# Patient Record
Sex: Male | Born: 1975 | Race: White | Hispanic: No | Marital: Married | Smoking: Never smoker
Health system: Southern US, Community
[De-identification: ages and names within clinical notes are randomized; demographics above are authoritative.]

## PROBLEM LIST (undated history)

## (undated) DIAGNOSIS — K589 Irritable bowel syndrome without diarrhea: Secondary | ICD-10-CM

## (undated) DIAGNOSIS — J45909 Unspecified asthma, uncomplicated: Secondary | ICD-10-CM

## (undated) DIAGNOSIS — N200 Calculus of kidney: Secondary | ICD-10-CM

## (undated) HISTORY — PX: LITHOTRIPSY: SUR834

---

## 2010-10-30 ENCOUNTER — Ambulatory Visit: Payer: Self-pay | Admitting: Internal Medicine

## 2011-05-05 ENCOUNTER — Emergency Department: Payer: Self-pay | Admitting: Emergency Medicine

## 2014-04-12 ENCOUNTER — Emergency Department: Payer: Self-pay | Admitting: Emergency Medicine

## 2014-04-12 LAB — URINALYSIS, COMPLETE
Bilirubin,UR: NEGATIVE
Glucose,UR: NEGATIVE mg/dL (ref 0–75)
KETONE: NEGATIVE
LEUKOCYTE ESTERASE: NEGATIVE
NITRITE: NEGATIVE
Ph: 6 (ref 4.5–8.0)
RBC,UR: 130 /HPF (ref 0–5)
Specific Gravity: 1.027 (ref 1.003–1.030)
Squamous Epithelial: <1

## 2014-04-12 LAB — CBC
HCT: 41.1 % (ref 40.0–52.0)
HGB: 14 g/dL (ref 13.0–18.0)
MCH: 30.3 pg (ref 26.0–34.0)
MCHC: 33.9 g/dL (ref 32.0–36.0)
MCV: 89 fL (ref 80–100)
PLATELETS: 214 10*3/uL (ref 150–440)
RBC: 4.6 10*6/uL (ref 4.40–5.90)
RDW: 12.9 % (ref 11.5–14.5)
WBC: 9.4 10*3/uL (ref 3.8–10.6)

## 2014-04-12 LAB — BASIC METABOLIC PANEL
Anion Gap: 8 (ref 7–16)
BUN: 21 mg/dL — ABNORMAL HIGH (ref 7–18)
CHLORIDE: 106 mmol/L (ref 98–107)
CO2: 26 mmol/L (ref 21–32)
Calcium, Total: 9.1 mg/dL (ref 8.5–10.1)
Creatinine: 1.5 mg/dL — ABNORMAL HIGH (ref 0.60–1.30)
EGFR (African American): >60
GFR CALC NON AF AMER: 58 — AB
Glucose: 118 mg/dL — ABNORMAL HIGH (ref 65–99)
OSMOLALITY: 283 (ref 275–301)
POTASSIUM: 3.9 mmol/L (ref 3.5–5.1)
Sodium: 140 mmol/L (ref 136–145)

## 2014-04-15 ENCOUNTER — Ambulatory Visit: Payer: Self-pay | Admitting: Urology

## 2014-04-16 ENCOUNTER — Emergency Department: Payer: Self-pay | Admitting: Emergency Medicine

## 2014-04-16 LAB — URINALYSIS, COMPLETE
BACTERIA: NONE SEEN
Bilirubin,UR: NEGATIVE
Glucose,UR: NEGATIVE mg/dL (ref 0–75)
Ketone: NEGATIVE
Nitrite: NEGATIVE
PH: 5 (ref 4.5–8.0)
Protein: 100
Specific Gravity: 1.021 (ref 1.003–1.030)
Squamous Epithelial: NONE SEEN
WBC UR: 122 /HPF (ref 0–5)

## 2014-04-16 LAB — COMPREHENSIVE METABOLIC PANEL
ALK PHOS: 111 U/L
AST: 48 U/L — AB (ref 15–37)
Albumin: 3.5 g/dL (ref 3.4–5.0)
Anion Gap: 7 (ref 7–16)
BUN: 19 mg/dL — ABNORMAL HIGH (ref 7–18)
Bilirubin,Total: 0.3 mg/dL (ref 0.2–1.0)
CALCIUM: 9.2 mg/dL (ref 8.5–10.1)
Chloride: 104 mmol/L (ref 98–107)
Co2: 30 mmol/L (ref 21–32)
Creatinine: 1.25 mg/dL (ref 0.60–1.30)
EGFR (African American): >60
EGFR (Non-African Amer.): >60
Glucose: 140 mg/dL — ABNORMAL HIGH (ref 65–99)
Osmolality: 286 (ref 275–301)
Potassium: 4 mmol/L (ref 3.5–5.1)
SGPT (ALT): 50 U/L (ref 12–78)
Sodium: 141 mmol/L (ref 136–145)
Total Protein: 8.3 g/dL — ABNORMAL HIGH (ref 6.4–8.2)

## 2014-04-16 LAB — CBC WITH DIFFERENTIAL/PLATELET
Basophil #: 0.1 10*3/uL (ref 0.0–0.1)
Basophil %: 0.8 %
EOS ABS: 0.3 10*3/uL (ref 0.0–0.7)
EOS PCT: 3.3 %
HCT: 39.6 % — AB (ref 40.0–52.0)
HGB: 13.4 g/dL (ref 13.0–18.0)
Lymphocyte #: 2 10*3/uL (ref 1.0–3.6)
Lymphocyte %: 20.7 %
MCH: 30.4 pg (ref 26.0–34.0)
MCHC: 33.9 g/dL (ref 32.0–36.0)
MCV: 90 fL (ref 80–100)
MONOS PCT: 5.6 %
Monocyte #: 0.5 x10 3/mm (ref 0.2–1.0)
Neutrophil #: 6.6 10*3/uL — ABNORMAL HIGH (ref 1.4–6.5)
Neutrophil %: 69.6 %
Platelet: 242 10*3/uL (ref 150–440)
RBC: 4.42 10*6/uL (ref 4.40–5.90)
RDW: 12.8 % (ref 11.5–14.5)
WBC: 9.5 10*3/uL (ref 3.8–10.6)

## 2015-01-21 NOTE — Op Note (Signed)
PATIENT NAME:  Theodore Warren, Theodore Warren MR#:  [id number] DATE OF BIRTH:  March 08, 1976  DATE OF PROCEDURE:  04/15/2014  PREOPERATIVE DIAGNOSIS: Upper left ureteral calculus with obstruction.   POSTOPERATIVE DIAGNOSIS: Upper left ureteral calculus with obstruction.   SURGEON: Richard D. Elnoria Howard, DO.   PROCEDURES:   1.  Left retrograde pyelogram. 2.  Ureteroscopy. 3.  Left laser lithotripsy with stent.   ANESTHESIA: General.   COMPLICATIONS: None.   DESCRIPTION OF PROCEDURE:  The patient was sterilely prepped and draped in supine lithotomy position for ease of approach to the external genitalia, and after an appropriate timeout, I began the procedure. The patient has good relaxation from a general anesthetic.  The urethra is normal.  Bladder is normal. The ureters are in a normal position. A left retrograde is done with a 5 [place] open-ended catheter.  Calculus is seen in the distal portion of the upper third of the ureter.  I put a wire up by it easily and over the wire I placed a 26 cm ureteral access catheter, which is a Chemical engineer catheter, and through this catheter I placed a flexible ureteroscope, and through the flexible ureteroscope place another wire that is within the access sheath. Then through the flexible digital ureteroscope, I was able to easily see the calculus right in front of me, right at the end of the access catheter, able to break it up quickly into multiple pieces utilizing a 365 micron holmium laser.  Breaks up into many, many pieces, and so I am not able to extract any at this point.  I wanted just to put a stent up and allow them to pass on their own. So, this was done and the stent is in good position in the bladder.  The bladder was emptied with a cystoscope and once it was emptied, I placed 30 mL of 0.5% plain Marcaine there and put B and O suppository in the rectum. Rectal exam is negative for tumors, masses, growths, or prostate cancer.      ____________________________ Janice Coffin. Elnoria Howard, [place] rdh:ts D: 04/15/2014 15:37:16 ET T: 04/15/2014 22:07:17 ET JOB#: [id number]  cc: Janice Coffin. Elnoria Howard, DO, <Dictator> RICHARD D HART DO ELECTRONICALLY SIGNED 04/28/2014 14:08

## 2017-05-27 ENCOUNTER — Encounter: Payer: Self-pay | Admitting: Emergency Medicine

## 2017-05-27 ENCOUNTER — Emergency Department
Admission: EM | Admit: 2017-05-27 | Discharge: 2017-05-27 | Disposition: A | Discharge status: Home / Self Care | Payer: Self-pay | Attending: Emergency Medicine | Admitting: Emergency Medicine

## 2017-05-27 ENCOUNTER — Emergency Department: Payer: Self-pay

## 2017-05-27 DIAGNOSIS — N23 Unspecified renal colic: Secondary | ICD-10-CM

## 2017-05-27 DIAGNOSIS — N132 Hydronephrosis with renal and ureteral calculous obstruction: Secondary | ICD-10-CM | POA: Insufficient documentation

## 2017-05-27 DIAGNOSIS — J45909 Unspecified asthma, uncomplicated: Secondary | ICD-10-CM | POA: Insufficient documentation

## 2017-05-27 HISTORY — DX: Calculus of kidney: N20.0

## 2017-05-27 HISTORY — DX: Unspecified asthma, uncomplicated: J45.909

## 2017-05-27 HISTORY — DX: Irritable bowel syndrome, unspecified: K58.9

## 2017-05-27 HISTORY — DX: Irritable bowel syndrome without diarrhea: K58.9

## 2017-05-27 LAB — URINALYSIS, COMPLETE (UACMP) WITH MICROSCOPIC
Bacteria, UA: NONE SEEN
Bilirubin Urine: NEGATIVE
GLUCOSE, UA: NEGATIVE mg/dL
Ketones, ur: NEGATIVE mg/dL
Leukocytes, UA: NEGATIVE
NITRITE: NEGATIVE
Protein, ur: 30 mg/dL — AB
SPECIFIC GRAVITY, URINE: 1.029 (ref 1.005–1.030)
Squamous Epithelial / LPF: NONE SEEN
pH: 5 (ref 5.0–8.0)

## 2017-05-27 LAB — CBC WITH DIFFERENTIAL/PLATELET
BASOS PCT: 1 %
Basophils Absolute: 0.1 10*3/uL (ref 0–0.1)
EOS ABS: 0.3 10*3/uL (ref 0–0.7)
Eosinophils Relative: 4 %
HCT: 40.2 % (ref 40.0–52.0)
HEMOGLOBIN: 13.9 g/dL (ref 13.0–18.0)
Lymphocytes Relative: 31 %
Lymphs Abs: 2.6 10*3/uL (ref 1.0–3.6)
MCH: 30.1 pg (ref 26.0–34.0)
MCHC: 34.6 g/dL (ref 32.0–36.0)
MCV: 87 fL (ref 80.0–100.0)
MONOS PCT: 9 %
Monocytes Absolute: 0.7 10*3/uL (ref 0.2–1.0)
NEUTROS PCT: 55 %
Neutro Abs: 4.7 10*3/uL (ref 1.4–6.5)
Platelets: 224 10*3/uL (ref 150–440)
RBC: 4.62 MIL/uL (ref 4.40–5.90)
RDW: 12.9 % (ref 11.5–14.5)
WBC: 8.4 10*3/uL (ref 3.8–10.6)

## 2017-05-27 LAB — BASIC METABOLIC PANEL
Anion gap: 9 (ref 5–15)
BUN: 27 mg/dL — ABNORMAL HIGH (ref 6–20)
CALCIUM: 9 mg/dL (ref 8.9–10.3)
CO2: 24 mmol/L (ref 22–32)
CREATININE: 1.08 mg/dL (ref 0.61–1.24)
Chloride: 108 mmol/L (ref 101–111)
Glucose, Bld: 141 mg/dL — ABNORMAL HIGH (ref 65–99)
Potassium: 3.5 mmol/L (ref 3.5–5.1)
Sodium: 141 mmol/L (ref 135–145)

## 2017-05-27 MED ORDER — HYDROMORPHONE HCL 1 MG/ML IJ SOLN
0.5000 mg | Freq: Once | INTRAMUSCULAR | Status: AC
  Administered 2017-05-27: 0.5 mg via INTRAVENOUS
  Filled 2017-05-27: qty 1

## 2017-05-27 MED ORDER — ONDANSETRON 4 MG PO TBDP: 4.0000 mg | ORAL_TABLET | Freq: Three times a day (TID) | ORAL | 0 refills | Status: AC | PRN

## 2017-05-27 MED ORDER — ONDANSETRON HCL 4 MG/2ML IJ SOLN
4.0000 mg | Freq: Once | INTRAMUSCULAR | Status: AC
  Administered 2017-05-27: 4 mg via INTRAVENOUS
  Filled 2017-05-27: qty 2

## 2017-05-27 MED ORDER — OXYCODONE-ACETAMINOPHEN 5-325 MG PO TABS: 1.0000 | ORAL_TABLET | Freq: Four times a day (QID) | ORAL | 0 refills | Status: AC | PRN

## 2017-05-27 NOTE — ED Triage Notes (Signed)
Patient ambulatory to triage with steady gait, without difficulty, appears uncomfortable; c/o left flank/side pain since 315am accomp by nausea; st hx of same with kidney stones

## 2017-05-27 NOTE — ED Notes (Signed)
L flank pain starting this morning @ 0345. Pt has hx of kidney stones.

## 2017-05-27 NOTE — Discharge Instructions (Signed)
Please return for worse pain fever vomiting. Please follow-up with urologist. Strain all urine see if you can catch a stone perhaps we can keep it from getting more.takes Percocet 1 pill 4 times a day if needed for pain.there is a spot in the bottom of your lung. Please have your doctor look at the CT scans that you had. It may be worth following up. The radiologist did not think it looked like a cancer.

## 2017-05-27 NOTE — ED Notes (Signed)
edp at bedside to re-eval.

## 2017-05-27 NOTE — ED Provider Notes (Signed)
[facility] Emergency Department Provider Note   ____________________________________________   First MD Initiated Contact with Patient 05/27/17 [phone]     (approximate)  I have reviewed the triage vital signs and the nursing notes.   HISTORY  Chief Complaint Flank Pain    HPI Theodore Warren is a 41 y.o. male who has a history of kidneystones. He reports he woke up at 3:15 this morning with bad left CVA area pain. It is radiating down towards the abdomen but not into the groin at this point. Patient's having difficulty urinating. He thinks he has another kidney stone. Does not have any fever he is getting somewhat nauseated now. Pain is moderately severe.   Past Medical History:  Diagnosis Date  . Asthma   . IBS (irritable bowel syndrome)   . Kidney stone     There are no active problems to display for this patient.   Past Surgical History:  Procedure Laterality Date  . LITHOTRIPSY      Prior to Admission medications   Not on File    Allergies Amoxicillin  No family history on file.  Social History Social History  Substance Use Topics  . Smoking status: Never Smoker  . Smokeless tobacco: Never Used  . Alcohol use No    Review of Systems  Constitutional: No fever/chills Eyes: No visual changes. ENT: No sore throat. Cardiovascular: Denies chest pain. Respiratory: Denies shortness of breath. Gastrointestinal: see history of present illness. Genitourinary: the history of present illness Musculoskeletal: the history of present illness Skin: Negative for rash. Neurological: Negative for headaches, focal weakness   ____________________________________________   PHYSICAL EXAM:  VITAL SIGNS: ED Triage Vitals  Enc Vitals Group     BP 05/27/17 0557 (!) 153/97     Pulse Rate 05/27/17 0557 (!) 59     Resp 05/27/17 0557 20     Temp 05/27/17 0557 98.9 F (37.2 C)     Temp Source 05/27/17 0557 Oral     SpO2 05/27/17 0557 96 %       Weight 05/27/17 0555 207 lb (93.9 kg)     Height 05/27/17 0555 5' 10"  (1.778 m)     Head Circumference --      Peak Flow --      Pain Score 05/27/17 0554 7     Pain Loc --      Pain Edu? --      Excl. in [place]? --     Constitutional: Alert and oriented. in some pain Eyes: Conjunctivae are normal. Head: Atraumatic. Nose: No congestion/rhinnorhea. Mouth/Throat: Mucous membranes are moist.  Oropharynx non-erythematous. Neck: No stridor.   Cardiovascular: Normal rate, regular rhythm. Grossly normal heart sounds.  Good peripheral circulation. Respiratory: Normal respiratory effort.  No retractions. Lungs CTAB. Gastrointestinal: Soft and nontender. No distention. No abdominal bruits. Mild left CVA tenderness. Musculoskeletal: No lower extremity tenderness nor edema.  No joint effusions. Neurologic:  Normal speech and language. No gross focal neurologic deficits are appreciated. No gait instability. Skin:  Skin is warm, dry and intact. No rash noted. Psychiatric: Mood and affect are normal. Speech and behavior are normal.  ____________________________________________   LABS (all labs ordered are listed, but only abnormal results are displayed)  Labs Reviewed  BASIC METABOLIC PANEL - Abnormal; Notable for the following:       Result Value   Glucose, Bld 141 (*)    BUN 27 (*)    All other components within normal limits  URINALYSIS, COMPLETE (UACMP)  WITH MICROSCOPIC - Abnormal; Notable for the following:    Color, Urine YELLOW (*)    APPearance CLEAR (*)    Hgb urine dipstick LARGE (*)    Protein, ur 30 (*)    All other components within normal limits  CBC WITH DIFFERENTIAL/PLATELET   ____________________________________________  EKG   ____________________________________________  RADIOLOGY  IMPRESSION: 1. Obstructing 2.5 mm left ureteral calculus at the L4 level. Moderate hydronephrosis. 2. Bilateral nephrolithiasis. 3. 3.7 cm abnormality in the medial right lung  base is suspicious for an AVM ; consider contrast-enhanced chest CT for confirmation.   Electronically Signed   By: Andreas Newport M.D.   On: 05/27/2017 07:01  ____________________________________________   PROCEDURES  Procedure(s) performed:   Procedures  Critical Care performed:   ____________________________________________   INITIAL IMPRESSION / ASSESSMENT AND PLAN / ED COURSE  Pertinent labs & imaging results that were available during my care of the patient were reviewed by me and considered in my medical decision making (see chart for details).        ____________________________________________   FINAL CLINICAL IMPRESSION(S) / ED DIAGNOSES  Final diagnoses:  Ureteral colic      NEW MEDICATIONS STARTED DURING THIS VISIT:  New Prescriptions   No medications on file     Note:  This document was prepared using Dragon voice recognition software and may include unintentional dictation errors.    Nena Polio, MD 05/27/17 [phone]

## 2018-03-24 DIAGNOSIS — E781 Pure hyperglyceridemia: Secondary | ICD-10-CM | POA: Diagnosis not present

## 2018-03-24 DIAGNOSIS — J4520 Mild intermittent asthma, uncomplicated: Secondary | ICD-10-CM | POA: Diagnosis not present

## 2018-03-24 DIAGNOSIS — Z125 Encounter for screening for malignant neoplasm of prostate: Secondary | ICD-10-CM | POA: Diagnosis not present

## 2018-03-24 DIAGNOSIS — Z0000 Encounter for general adult medical examination without abnormal findings: Secondary | ICD-10-CM | POA: Diagnosis not present

## 2018-11-30 IMAGING — CT CT RENAL STONE PROTOCOL
2 of 4 series · 16 of 46 positions shown, 18 images · non-contrast
Comparison: 04/12/2014

CLINICAL DATA: Sudden onset left-sided flank pain at [DATE]

EXAM:
CT ABDOMEN AND PELVIS WITHOUT CONTRAST
TECHNIQUE: Multidetector CT imaging of the abdomen and pelvis was performed
following the standard protocol without IV contrast.

[Series 2: stone full standard · axial · 0.74mm/px · z∈[-952,-482]mm · 13 of 104 slices shown, 15 images]
[im 5/104  soft-tissue]
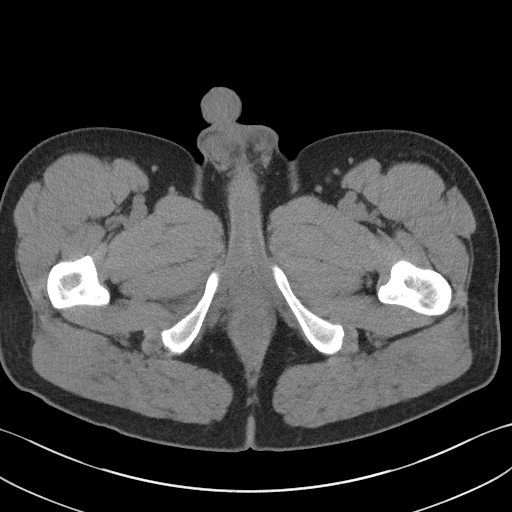
[im 5/104  bone]
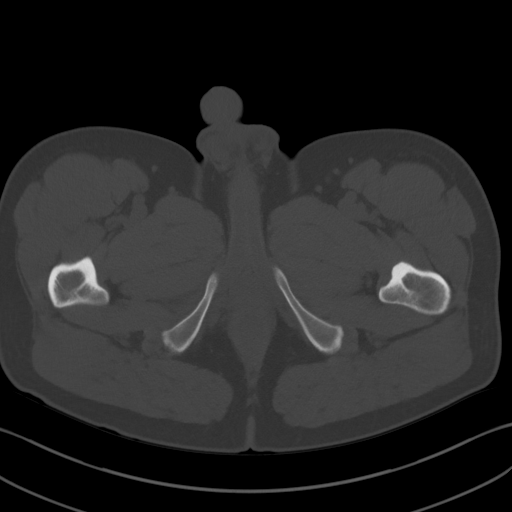
[im 13/104  soft-tissue]
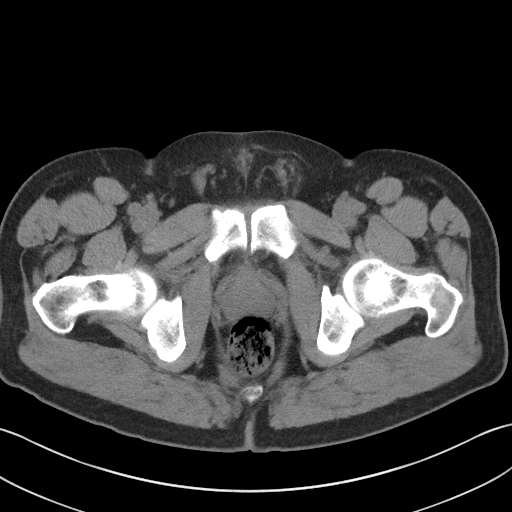
[im 22/104  soft-tissue]
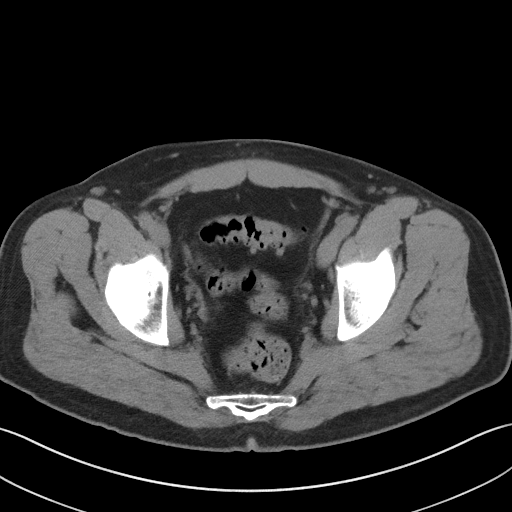
[im 31/104  soft-tissue]
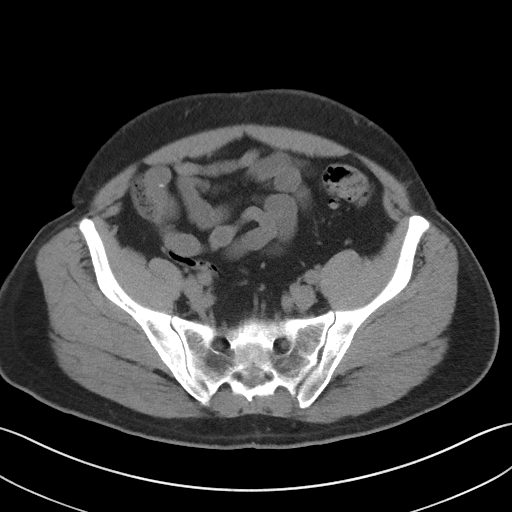
[im 35/104  soft-tissue]
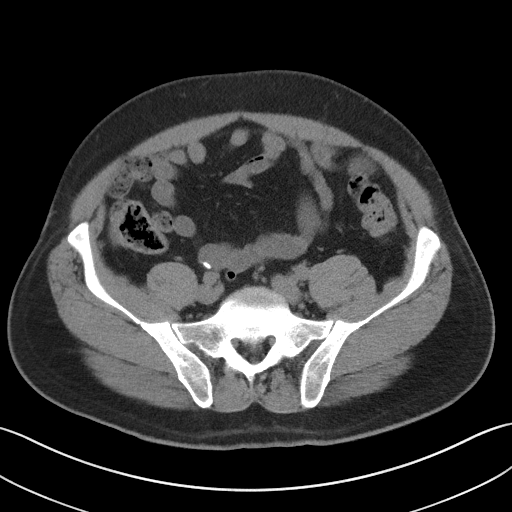
[im 43/104  soft-tissue]
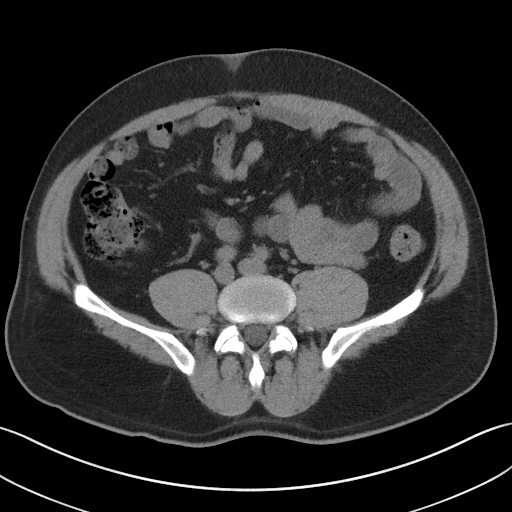
[im 52/104  soft-tissue]
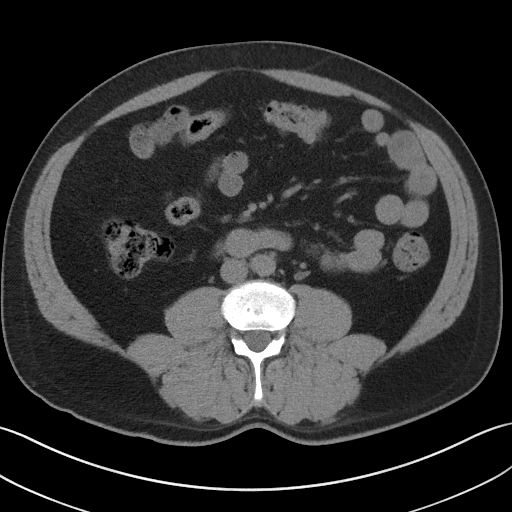
[im 61/104  soft-tissue]
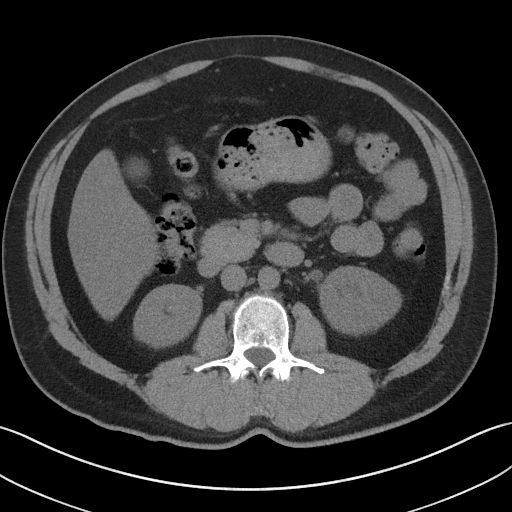
[im 69/104  soft-tissue]
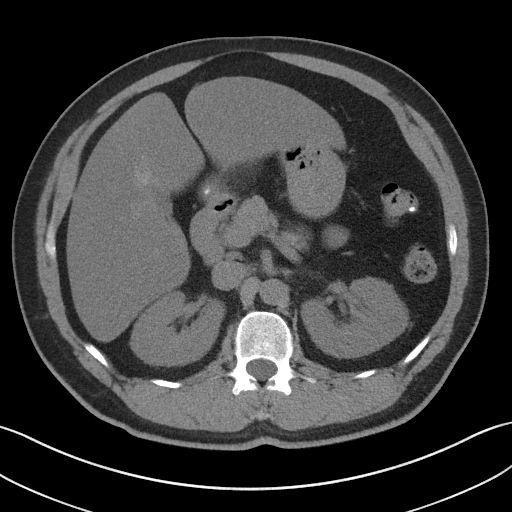
[im 69/104  bone]
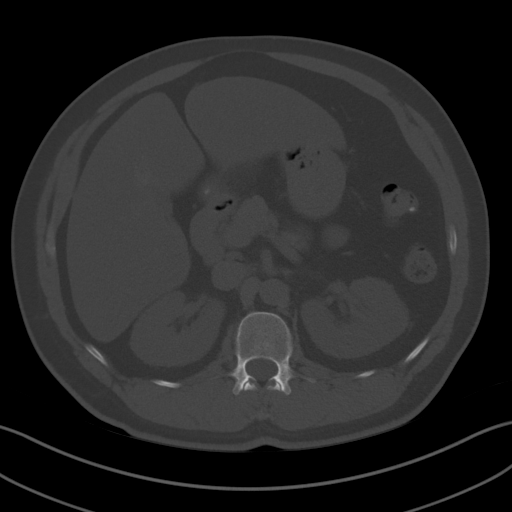
[im 73/104  soft-tissue]
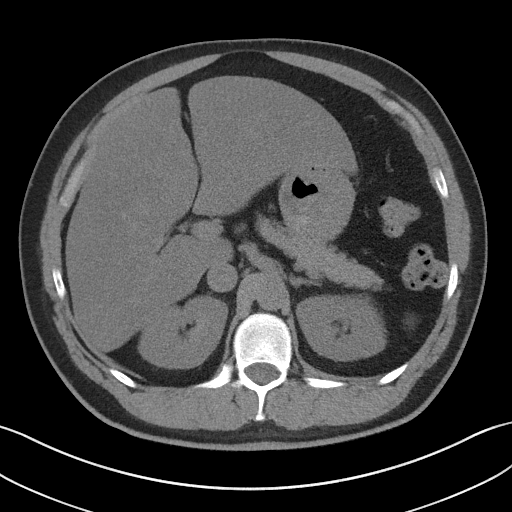
[im 82/104  soft-tissue]
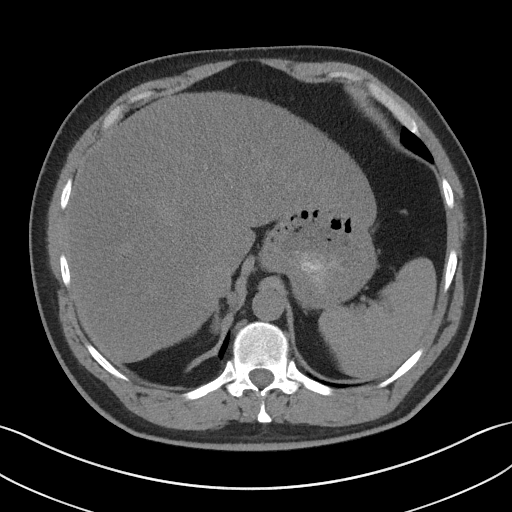
[im 91/104  soft-tissue]
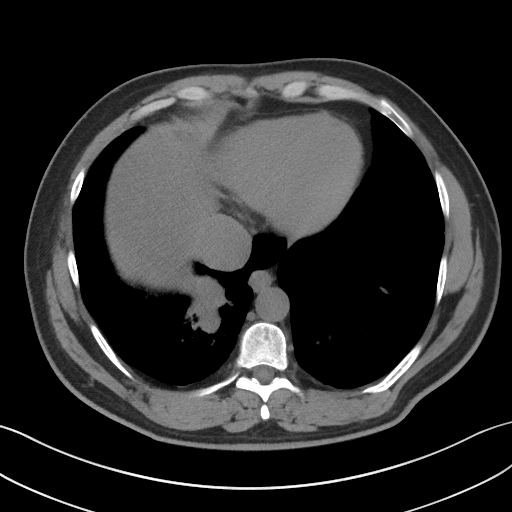
[im 99/104  soft-tissue]
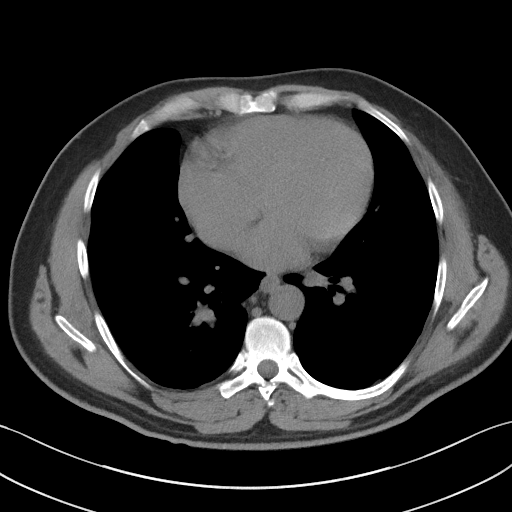

[Series 5: coronal · coronal · 0.83mm/px · 3 of 145 slices shown]
[im 49/145  soft-tissue]
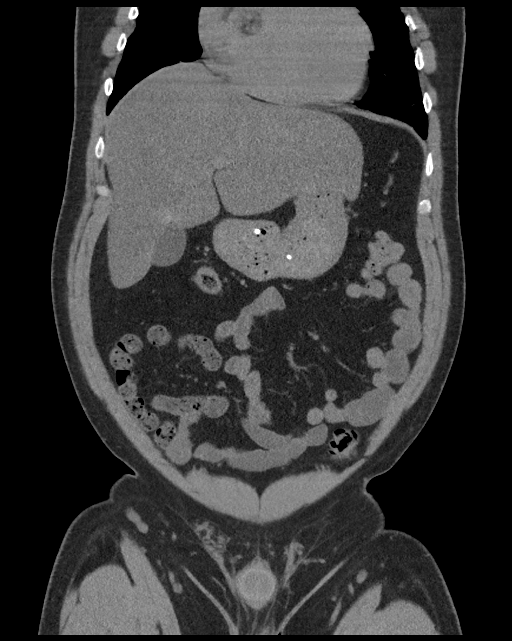
[im 65/145  soft-tissue]
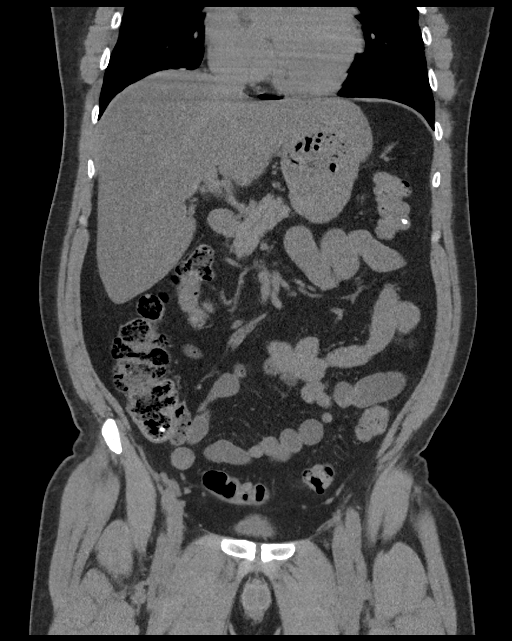
[im 81/145  soft-tissue]
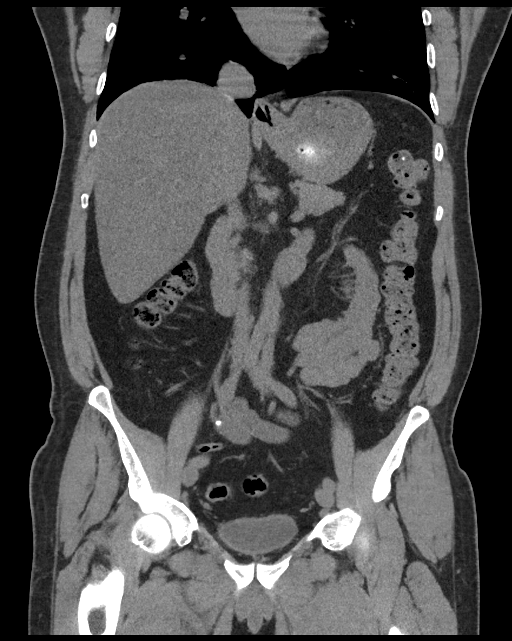

[16 of 46 positions shown; findings below may reference images not displayed]

FINDINGS: Lower chest: Stable linear scarring in the right middle lobe base.
Stable 3.7 cm nodular/tubular lesion in the right base without
interval change from 04/12/2014. This could be a vascular
malformation, given the suggestion of prominent feeding vessels.

Hepatobiliary: Diffuse fatty infiltration of the liver without
significant focal lesion. Gallbladder and bile ducts are
unremarkable.

Pancreas: Unremarkable. No pancreatic ductal dilatation or
surrounding inflammatory changes.

Spleen: Normal in size without focal abnormality.

Adrenals/Urinary Tract: Both adrenals are normal.

Obstructing 2.5 mm calculus of the left ureter at the L4 level.
Moderate hydronephrosis. Probable punctate calculi elsewhere in the
renal collecting systems bilaterally.

1.8 cm water density lesion of the right renal midpole parenchyma,
probably a cyst but not conclusively characterized.

Unremarkable urinary bladder.

Stomach/Bowel: Stomach is within normal limits. Appendix is normal.
No evidence of bowel wall thickening, distention, or inflammatory
changes.

Vascular/Lymphatic: No significant vascular findings are present. No
enlarged abdominal or pelvic lymph nodes.

Reproductive: Unremarkable

Other: Small fat containing umbilical hernia.

Musculoskeletal: No significant skeletal lesion.
IMPRESSION: 1. Obstructing 2.5 mm left ureteral calculus at the L4 level.
Moderate hydronephrosis.
2. Bilateral nephrolithiasis.
3. 3.7 cm abnormality in the medial right lung base is suspicious
for an AVM ; consider contrast-enhanced chest CT for confirmation.
